# Patient Record
Sex: Male | Born: 1990 | Race: Black or African American | Hispanic: No | Marital: Single | Smoking: Never smoker
Health system: Southern US, Community
[De-identification: ages and names within clinical notes are randomized; demographics above are authoritative.]

---

## 2015-08-05 ENCOUNTER — Emergency Department (HOSPITAL_COMMUNITY)
Admission: EM | Admit: 2015-08-05 | Discharge: 2015-08-05 | Disposition: A | Discharge status: Home / Self Care | Payer: No Typology Code available for payment source | Attending: Emergency Medicine | Admitting: Emergency Medicine

## 2015-08-05 ENCOUNTER — Encounter (HOSPITAL_COMMUNITY): Payer: Self-pay | Admitting: *Deleted

## 2015-08-05 DIAGNOSIS — G8929 Other chronic pain: Secondary | ICD-10-CM | POA: Insufficient documentation

## 2015-08-05 DIAGNOSIS — S39012A Strain of muscle, fascia and tendon of lower back, initial encounter: Secondary | ICD-10-CM | POA: Insufficient documentation

## 2015-08-05 DIAGNOSIS — S3992XA Unspecified injury of lower back, initial encounter: Secondary | ICD-10-CM | POA: Diagnosis present

## 2015-08-05 DIAGNOSIS — Y9389 Activity, other specified: Secondary | ICD-10-CM | POA: Diagnosis not present

## 2015-08-05 DIAGNOSIS — Y998 Other external cause status: Secondary | ICD-10-CM | POA: Insufficient documentation

## 2015-08-05 DIAGNOSIS — G44209 Tension-type headache, unspecified, not intractable: Secondary | ICD-10-CM | POA: Insufficient documentation

## 2015-08-05 DIAGNOSIS — Y92410 Unspecified street and highway as the place of occurrence of the external cause: Secondary | ICD-10-CM | POA: Insufficient documentation

## 2015-08-05 MED ORDER — OXYCODONE-ACETAMINOPHEN 5-325 MG PO TABS
2.0000 | ORAL_TABLET | Freq: Once | ORAL | Status: AC
  Administered 2015-08-05: 2 via ORAL
  Filled 2015-08-05: qty 2

## 2015-08-05 MED ORDER — METHOCARBAMOL 500 MG PO TABS
500.0000 mg | ORAL_TABLET | Freq: Once | ORAL | Status: AC
  Administered 2015-08-05: 500 mg via ORAL
  Filled 2015-08-05: qty 1

## 2015-08-05 MED ORDER — NAPROXEN 500 MG PO TABS: 500.0000 mg | ORAL_TABLET | Freq: Two times a day (BID) | ORAL | Status: AC

## 2015-08-05 MED ORDER — METHOCARBAMOL 500 MG PO TABS: 500.0000 mg | ORAL_TABLET | Freq: Two times a day (BID) | ORAL | Status: AC
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
## 2018-02-09 ENCOUNTER — Emergency Department (HOSPITAL_COMMUNITY)
Admission: EM | Admit: 2018-02-09 | Discharge: 2018-02-10 | Disposition: A | Discharge status: Home / Self Care | Payer: No Typology Code available for payment source | Attending: Emergency Medicine | Admitting: Emergency Medicine

## 2018-02-09 ENCOUNTER — Encounter (HOSPITAL_COMMUNITY): Payer: Self-pay | Admitting: *Deleted

## 2018-02-09 ENCOUNTER — Other Ambulatory Visit: Payer: Self-pay

## 2018-02-09 ENCOUNTER — Emergency Department (HOSPITAL_COMMUNITY): Payer: No Typology Code available for payment source

## 2018-02-09 DIAGNOSIS — M62830 Muscle spasm of back: Secondary | ICD-10-CM | POA: Diagnosis not present

## 2018-02-09 DIAGNOSIS — M545 Low back pain: Secondary | ICD-10-CM | POA: Diagnosis not present

## 2018-02-09 DIAGNOSIS — Y998 Other external cause status: Secondary | ICD-10-CM | POA: Insufficient documentation

## 2018-02-09 DIAGNOSIS — Y939 Activity, unspecified: Secondary | ICD-10-CM | POA: Diagnosis not present

## 2018-02-09 DIAGNOSIS — M62838 Other muscle spasm: Secondary | ICD-10-CM

## 2018-02-09 DIAGNOSIS — Y92410 Unspecified street and highway as the place of occurrence of the external cause: Secondary | ICD-10-CM | POA: Insufficient documentation

## 2018-02-09 MED ORDER — IBUPROFEN 800 MG PO TABS
800.0000 mg | ORAL_TABLET | Freq: Once | ORAL | Status: AC
  Administered 2018-02-09: 800 mg via ORAL
  Filled 2018-02-09: qty 1

## 2018-02-09 MED ORDER — IBUPROFEN 800 MG PO TABS: 800.0000 mg | ORAL_TABLET | Freq: Three times a day (TID) | ORAL | 0 refills | Status: AC | PRN

## 2018-02-09 MED ORDER — CYCLOBENZAPRINE HCL 10 MG PO TABS: 10.0000 mg | ORAL_TABLET | Freq: Three times a day (TID) | ORAL | 0 refills | Status: AC | PRN

## 2018-02-09 MED ORDER — CYCLOBENZAPRINE HCL 10 MG PO TABS
10.0000 mg | ORAL_TABLET | Freq: Once | ORAL | Status: AC
  Administered 2018-02-09: 10 mg via ORAL
  Filled 2018-02-09: qty 1
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
## 2023-07-01 ENCOUNTER — Emergency Department (HOSPITAL_COMMUNITY)
Admission: EM | Admit: 2023-07-01 | Discharge: 2023-07-01 | Disposition: A | Discharge status: Home / Self Care | Payer: Self-pay | Attending: Emergency Medicine | Admitting: Emergency Medicine

## 2023-07-01 ENCOUNTER — Encounter (HOSPITAL_COMMUNITY): Payer: Self-pay

## 2023-07-01 ENCOUNTER — Other Ambulatory Visit: Payer: Self-pay

## 2023-07-01 DIAGNOSIS — K120 Recurrent oral aphthae: Secondary | ICD-10-CM | POA: Insufficient documentation

## 2023-07-01 MED ORDER — LIDOCAINE VISCOUS HCL 2 % MT SOLN
15.0000 mL | Freq: Once | OROMUCOSAL | Status: AC
Start: 2023-07-01 — End: 2023-07-01
  Administered 2023-07-01: 15 mL via ORAL
  Filled 2023-07-01: qty 15

## 2023-07-01 NOTE — ED Provider Notes (Signed)
[place] EMERGENCY DEPARTMENT AT [facility] Provider Note   CSN: [id number] Arrival date & time: 07/01/23  1125     History  Chief Complaint  Patient presents with   Mouth Lesions    Bryan Hawkins is a 33 y.o. male without significant past medical history reporting to emergency room with mucosal ulcerations.  Patient has ulcers on mucosal surface of upper and lower lip, below the tongue.  Ulcers are erythematous and painful.  They occurred suddenly on Thursday. Reports feeling chilled. No new medications, or change in diet. Reports had flu-like symptoms last week which have now resolved. Able to eat and drink, but painful.    Mouth Lesions      Home Medications Prior to Admission medications   Medication Sig Start Date End Date Taking? Authorizing Provider  cyclobenzaprine (FLEXERIL) 10 MG tablet Take 1 tablet (10 mg total) by mouth 3 (three) times daily as needed for up to 12 doses for muscle spasms. 02/09/18   Curatolo, Adam, DO  ibuprofen (ADVIL,MOTRIN) 800 MG tablet Take 1 tablet (800 mg total) by mouth every 8 (eight) hours as needed for up to 30 doses. 02/09/18   Curatolo, Adam, DO  methocarbamol (ROBAXIN) 500 MG tablet Take 1 tablet (500 mg total) by mouth 2 (two) times daily. 08/05/15   Antony Madura, PA-C  naproxen (NAPROSYN) 500 MG tablet Take 1 tablet (500 mg total) by mouth 2 (two) times daily. 08/05/15   Antony Madura, PA-C      Allergies    Patient has no known allergies.    Review of Systems   Review of Systems  HENT:  Positive for mouth sores.     Physical Exam Updated Vital Signs BP 108/60   Pulse 72   Temp 98.4 F (36.9 C) (Oral)   Resp 18   Ht 6' (1.829 m)   Wt 72.6 kg   SpO2 98%   BMI 21.71 kg/m  Physical Exam Vitals and nursing note reviewed.  Constitutional:      General: He is not in acute distress.    Appearance: He is not toxic-appearing.  HENT:     Head: Normocephalic and atraumatic.     Mouth/Throat:     Pharynx: No  oropharyngeal exudate, posterior oropharyngeal erythema or uvula swelling.     Tonsils: No tonsillar abscesses.     Comments: Mucosal ulceration to upper and lower gums Eyes:     General: No scleral icterus.    Conjunctiva/sclera: Conjunctivae normal.  Cardiovascular:     Rate and Rhythm: Normal rate and regular rhythm.     Pulses: Normal pulses.     Heart sounds: Normal heart sounds.  Pulmonary:     Effort: Pulmonary effort is normal. No respiratory distress.     Breath sounds: Normal breath sounds.  Abdominal:     General: Abdomen is flat. Bowel sounds are normal.     Palpations: Abdomen is soft.     Tenderness: There is no abdominal tenderness.  Skin:    General: Skin is warm and dry.     Findings: No lesion.  Neurological:     General: No focal deficit present.     Mental Status: He is alert and oriented to person, place, and time. Mental status is at baseline.     ED Results / Procedures / Treatments   Labs (all labs ordered are listed, but only abnormal results are displayed) Labs Reviewed - No data to display  EKG None  Radiology No results  found.  Procedures Procedures    Medications Ordered in ED Medications - No data to display  ED Course/ Medical Decision Making/ A&P                                 Medical Decision Making Risk Prescription drug management.   This patient presents to the ED for concern of mucosal lesions, this involves an extensive number of treatment options, and is a complaint that carries with it a high risk of complications and morbidity.  The differential diagnosis includes viral infection, thrush, hand foot and mouth, syphilis      Problem List / ED Course / Critical interventions / Medication management  Patient reporting with oral ulcers consistent with aphthous ulcer.  Ulcer multiple small white in color on erythematous base.  Patient is hemodynamically stable well-appearing.  No fever.  Does not have any rash  involvement.  Patient is tolerating oral intake.  Denies any chest pain shortness of breath, abdominal pain nausea vomiting diarrhea.  Given presentation I do not feel like further labs or imaging are necessary today.  Patient had viscous lidocaine here in the emergency room that improved symptoms significantly.  Discussed over-the-counter management of symptoms.  Discussed patient's case with attending Dr. Jearld Fenton prior to discharge who agreed to plan. I ordered medication including Viscous lidocaine   Reevaluation of the patient after these medicines showed that the patient improved I have reviewed the patients home medicines and have made adjustments as needed   Plan  F/u w/ PCP in 2-3d to ensure resolution of sx.  Patient was given return precautions. Patient stable for discharge at this time.  Patient educated on sx/dx and verbalized understanding of plan. Return to ER w/ new or worsening sx.          Final Clinical Impression(s) / ED Diagnoses Final diagnoses:  Aphthous ulcer of mouth    Rx / DC Orders ED Discharge Orders     None         Smitty Knudsen, PA-C 07/01/23 1601    Loetta Rough, MD 07/02/23 [phone]

## 2023-07-01 NOTE — ED Triage Notes (Signed)
Mouth sores on bottom inner mouth. Pt states this started Thursday. Pain when eating and drinking.

## 2023-07-01 NOTE — Discharge Instructions (Addendum)
Recommend Anbesol or Orajel which she can buy over-the-counter.  Patient is staying well-hydrated.  Please follow-up with primary care or return to emergency room if you have any new or worsening symptoms.
